# Patient Record
Sex: Female | Born: 1990
Health system: Southern US, Community
[De-identification: ages and names within clinical notes are randomized; demographics above are authoritative.]

## PROBLEM LIST (undated history)

## (undated) DIAGNOSIS — D649 Anemia, unspecified: Secondary | ICD-10-CM

## (undated) DIAGNOSIS — R7989 Other specified abnormal findings of blood chemistry: Secondary | ICD-10-CM

## (undated) HISTORY — DX: Other specified abnormal findings of blood chemistry: R79.89

## (undated) HISTORY — DX: Anemia, unspecified: D64.9

---

## 2015-03-21 ENCOUNTER — Emergency Department (HOSPITAL_COMMUNITY)
Admission: EM | Admit: 2015-03-21 | Discharge: 2015-03-21 | Disposition: A | Discharge status: Home / Self Care | Payer: BLUE CROSS/BLUE SHIELD | Source: Home / Self Care | Attending: Emergency Medicine | Admitting: Emergency Medicine

## 2015-03-21 ENCOUNTER — Encounter (HOSPITAL_COMMUNITY): Payer: Self-pay | Admitting: Emergency Medicine

## 2015-03-21 DIAGNOSIS — N644 Mastodynia: Secondary | ICD-10-CM

## 2015-03-21 LAB — POCT URINALYSIS DIP (DEVICE)
BILIRUBIN URINE: NEGATIVE
Glucose, UA: NEGATIVE mg/dL
Ketones, ur: NEGATIVE mg/dL
LEUKOCYTES UA: NEGATIVE
NITRITE: NEGATIVE
PH: 5.5 (ref 5.0–8.0)
Protein, ur: NEGATIVE mg/dL
Specific Gravity, Urine: >=1.03 (ref 1.005–1.030)
Urobilinogen, UA: 0.2 mg/dL (ref 0.0–1.0)

## 2015-03-21 LAB — POCT PREGNANCY, URINE: PREG TEST UR: NEGATIVE

## 2015-03-21 NOTE — Discharge Instructions (Signed)
Your pregnancy test is negative. You likely have fibrocystic change in your breast. This may fluctuate in tenderness with your periods. You can take Tylenol or ibuprofen as needed for discomfort. Ice or heat may help as well. Follow-up with your PCP to discuss additional workup with ultrasound or mammogram.

## 2015-03-21 NOTE — ED Provider Notes (Signed)
CSN: [id number]     Arrival date & time 03/21/15  0804 History   First MD Initiated Contact with Patient 03/21/15 [phone]     Chief Complaint  Patient presents with  . Breast Pain   (Consider location/radiation/quality/duration/timing/severity/associated sxs/prior Treatment) HPI  She is a 24 year old woman here for evaluation of right breast pain. She states she has had a sore spot in her right breast for several years. It has been checked out previously and she was told it was benign. However, this morning it was hurting quite a bit more than normal so she wanted to get it checked out. [place] in [place] she is on her dad's health insurance. She does have an appointment scheduled with him for next month. She denies any skin changes, nipple discharge, fevers. Her last period was the end of February. No known family history of breast cancer, but she states her grandmother died of metastatic cancer (unknown primary) at age 780.  History reviewed. No pertinent past medical history. History reviewed. No pertinent past surgical history. History reviewed. No pertinent family history. History  Substance Use Topics  . Smoking status: Never Smoker   . Smokeless tobacco: Not on file  . Alcohol Use: Yes     Comment: occassional   OB History    No data available     Review of Systems  Constitutional: Negative for fever.  Genitourinary:       Breast pain    Allergies  Review of patient's allergies indicates no known allergies.  Home Medications   Prior to Admission medications   Not on File   BP 111/60 mmHg  Pulse 86  Temp(Src) 97.9 F (36.6 C) (Oral)  Resp 16  SpO2 100%  LMP 02/18/2015 Physical Exam  Constitutional: She is oriented to person, place, and time. She appears well-developed and well-nourished. No distress.  Cardiovascular: Normal rate.   Pulmonary/Chest: Effort normal.  Genitourinary:  Right breast: No erythema or skin changes. No nipple  discharge. No axillary lymphadenopathy. She does have some tender nodularity in the upper and lower outer quadrants.  Neurological: She is alert and oriented to person, place, and time.    ED Course  Procedures (including critical care time) Labs Review Labs Reviewed  POCT URINALYSIS DIP (DEVICE) - Abnormal; Notable for the following:    Hgb urine dipstick TRACE (*)    All other components within normal limits  POCT PREGNANCY, URINE    Imaging Review No results found.   MDM   1. Breast tenderness    Breast exam is most consistent with fibrocystic tissue.  Nothing to suggest mastitis.  Pregnancy test is negative. Recommended follow-up with PCP as scheduled next month to discuss mammogram versus ultrasound for additional evaluation. She can use Tylenol or ibuprofen as needed for pain. Also recommended trying ice or heat as needed.    Charm RingsErin J Honig, MD 03/21/15 [phone]

## 2015-03-21 NOTE — ED Notes (Signed)
Pt has pain in the right breast pt states that lump has been there for a few years but this morning it is painful.

## 2016-01-24 MED FILL — AMOXICILLIN 500 MG CAPSULE: 500 | 7 days supply | Qty: 24 | Fill #0

## 2016-03-12 DIAGNOSIS — Z0000 Encounter for general adult medical examination without abnormal findings: Secondary | ICD-10-CM | POA: Diagnosis not present

## 2016-03-12 DIAGNOSIS — Z13220 Encounter for screening for lipoid disorders: Secondary | ICD-10-CM | POA: Diagnosis not present

## 2016-03-12 DIAGNOSIS — E559 Vitamin D deficiency, unspecified: Secondary | ICD-10-CM | POA: Diagnosis not present

## 2016-06-07 DIAGNOSIS — E559 Vitamin D deficiency, unspecified: Secondary | ICD-10-CM | POA: Diagnosis not present

## 2016-06-10 DIAGNOSIS — Z23 Encounter for immunization: Secondary | ICD-10-CM | POA: Diagnosis not present

## 2016-08-07 ENCOUNTER — Other Ambulatory Visit (HOSPITAL_COMMUNITY)
Admission: RE | Admit: 2016-08-07 | Discharge: 2016-08-07 | Disposition: A | Discharge status: Home / Self Care | Payer: BLUE CROSS/BLUE SHIELD | Source: Ambulatory Visit | Attending: Family Medicine | Admitting: Family Medicine

## 2016-08-07 ENCOUNTER — Other Ambulatory Visit: Payer: Self-pay | Admitting: Family Medicine

## 2016-08-07 DIAGNOSIS — Z01419 Encounter for gynecological examination (general) (routine) without abnormal findings: Secondary | ICD-10-CM | POA: Insufficient documentation

## 2016-08-07 DIAGNOSIS — Z113 Encounter for screening for infections with a predominantly sexual mode of transmission: Secondary | ICD-10-CM | POA: Insufficient documentation

## 2016-08-12 LAB — CYTOLOGY - PAP

## 2016-11-19 DIAGNOSIS — R229 Localized swelling, mass and lump, unspecified: Secondary | ICD-10-CM | POA: Diagnosis not present

## 2017-04-14 ENCOUNTER — Other Ambulatory Visit: Payer: Self-pay | Admitting: Family Medicine

## 2017-04-14 DIAGNOSIS — E010 Iodine-deficiency related diffuse (endemic) goiter: Secondary | ICD-10-CM | POA: Diagnosis not present

## 2017-04-14 DIAGNOSIS — E559 Vitamin D deficiency, unspecified: Secondary | ICD-10-CM | POA: Diagnosis not present

## 2017-04-14 DIAGNOSIS — Z0001 Encounter for general adult medical examination with abnormal findings: Secondary | ICD-10-CM | POA: Diagnosis not present

## 2017-04-14 DIAGNOSIS — Z13220 Encounter for screening for lipoid disorders: Secondary | ICD-10-CM | POA: Diagnosis not present

## 2017-05-12 ENCOUNTER — Ambulatory Visit
Admission: RE | Admit: 2017-05-12 | Discharge: 2017-05-12 | Disposition: A | Discharge status: Home / Self Care | Payer: 59 | Source: Ambulatory Visit | Attending: Family Medicine | Admitting: Family Medicine

## 2017-05-12 DIAGNOSIS — E010 Iodine-deficiency related diffuse (endemic) goiter: Secondary | ICD-10-CM

## 2017-05-16 DIAGNOSIS — R0982 Postnasal drip: Secondary | ICD-10-CM | POA: Diagnosis not present

## 2017-05-16 DIAGNOSIS — J028 Acute pharyngitis due to other specified organisms: Secondary | ICD-10-CM | POA: Diagnosis not present

## 2018-04-22 ENCOUNTER — Other Ambulatory Visit (HOSPITAL_COMMUNITY)
Admission: RE | Admit: 2018-04-22 | Discharge: 2018-04-22 | Disposition: A | Discharge status: Home / Self Care | Payer: 59 | Source: Ambulatory Visit | Attending: Family Medicine | Admitting: Family Medicine

## 2018-04-22 ENCOUNTER — Other Ambulatory Visit: Payer: Self-pay | Admitting: Family Medicine

## 2018-04-22 DIAGNOSIS — Z0000 Encounter for general adult medical examination without abnormal findings: Secondary | ICD-10-CM | POA: Diagnosis not present

## 2018-04-22 DIAGNOSIS — Z01419 Encounter for gynecological examination (general) (routine) without abnormal findings: Secondary | ICD-10-CM | POA: Insufficient documentation

## 2018-04-22 DIAGNOSIS — E559 Vitamin D deficiency, unspecified: Secondary | ICD-10-CM | POA: Diagnosis not present

## 2018-04-24 LAB — CYTOLOGY - PAP: DIAGNOSIS: NEGATIVE

## 2018-05-20 DIAGNOSIS — Z0000 Encounter for general adult medical examination without abnormal findings: Secondary | ICD-10-CM | POA: Diagnosis not present

## 2018-08-14 IMAGING — US US THYROID
1 series · 14 of 25 positions shown · non-contrast
Comparison: None.

CLINICAL DATA: Palpable abnormality. Enlarged thyroid gland by
physical exam.

EXAM:
THYROID ULTRASOUND
TECHNIQUE: Ultrasound examination of the thyroid gland and adjacent soft
tissues was performed.

[Series 1: us thyroid · 0.06mm/px · 14 of 33 slices shown]
[im 1/33]
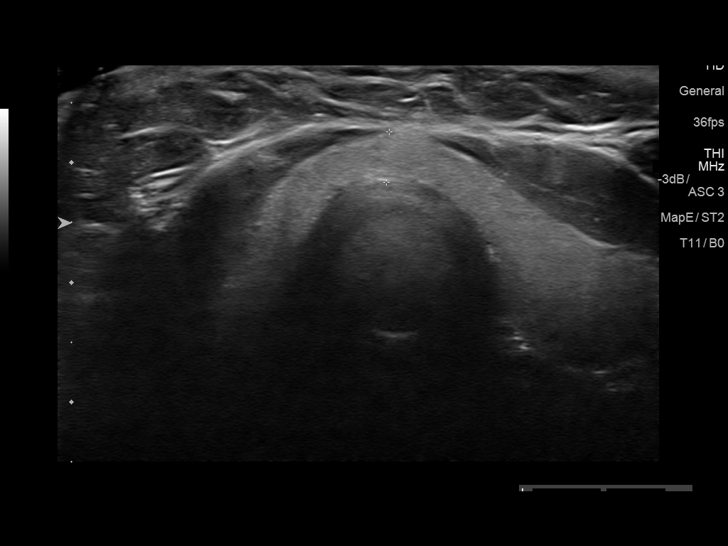
[im 3/33]
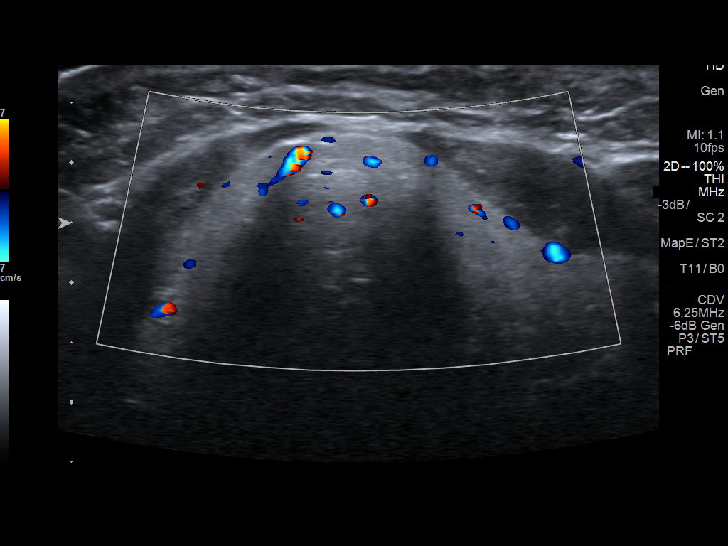
[im 6/33]
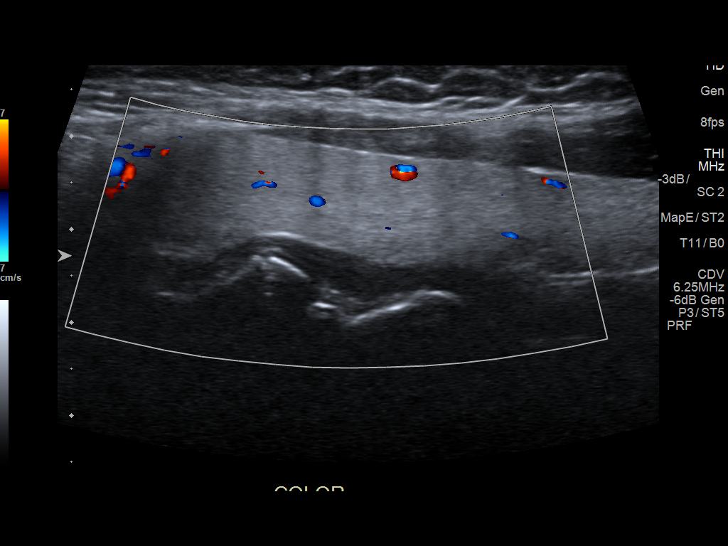
[im 9/33]
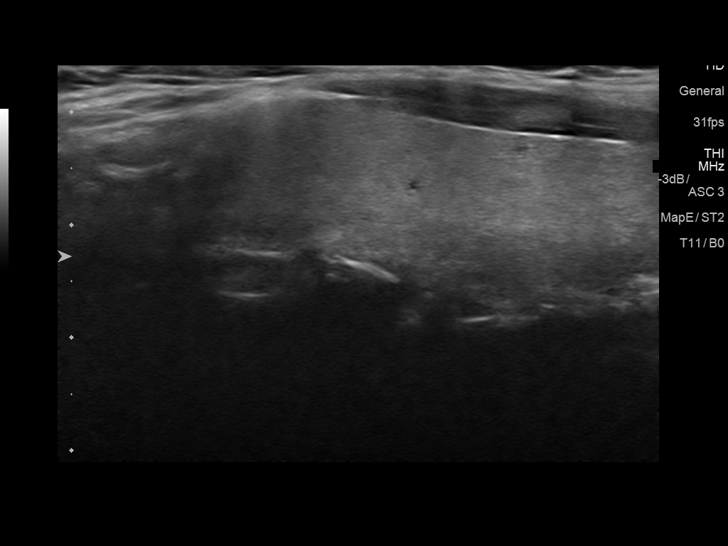
[im 11/33]
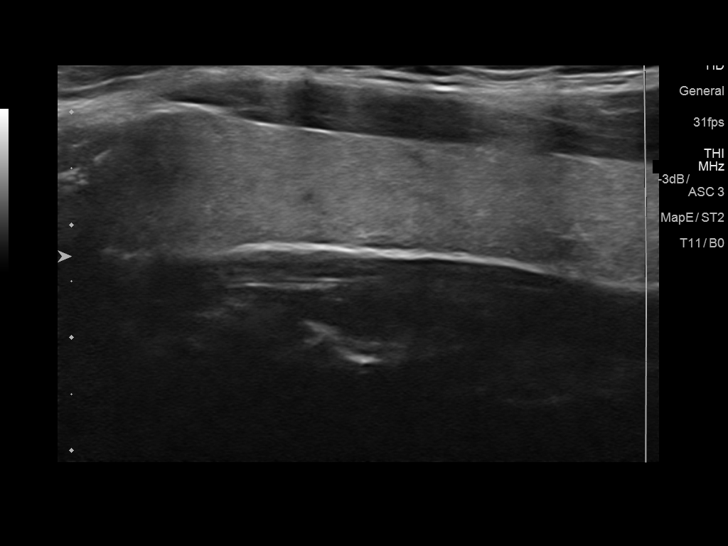
[im 13/33]
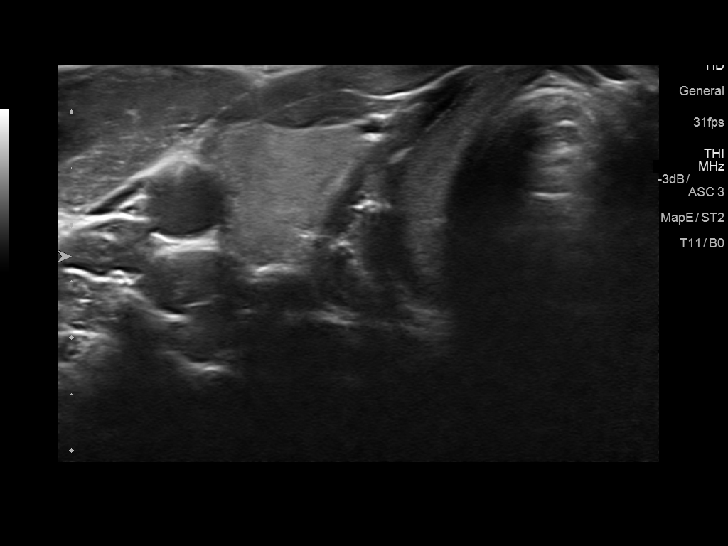
[im 15/33]
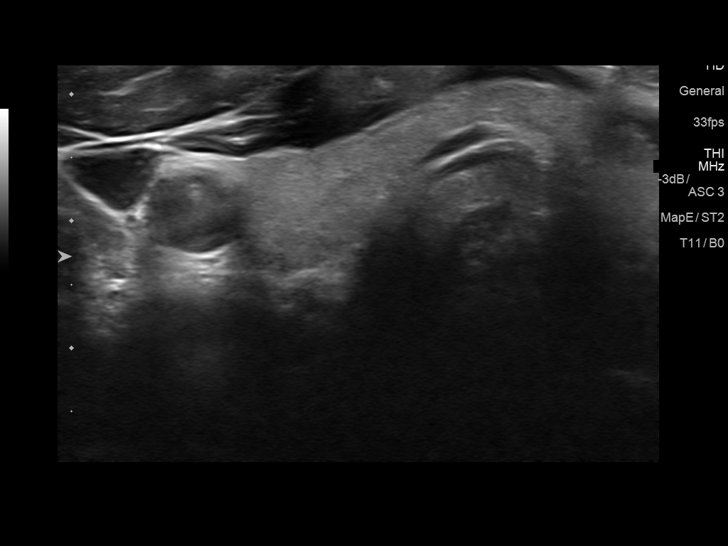
[im 18/33]
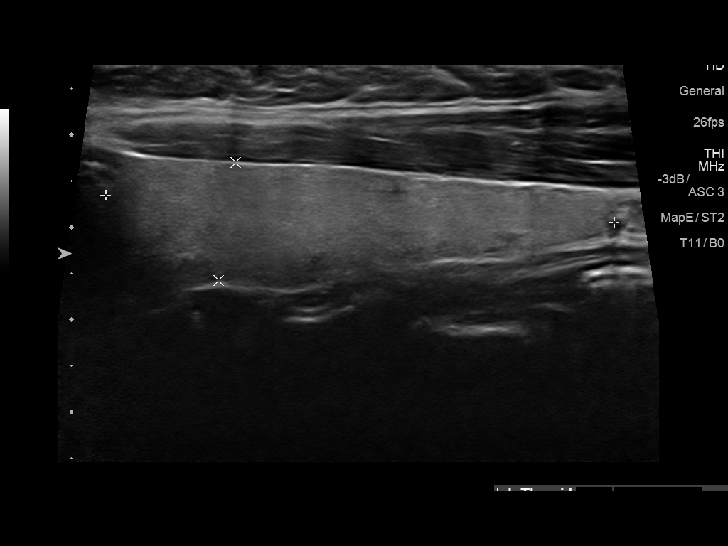
[im 21/33]
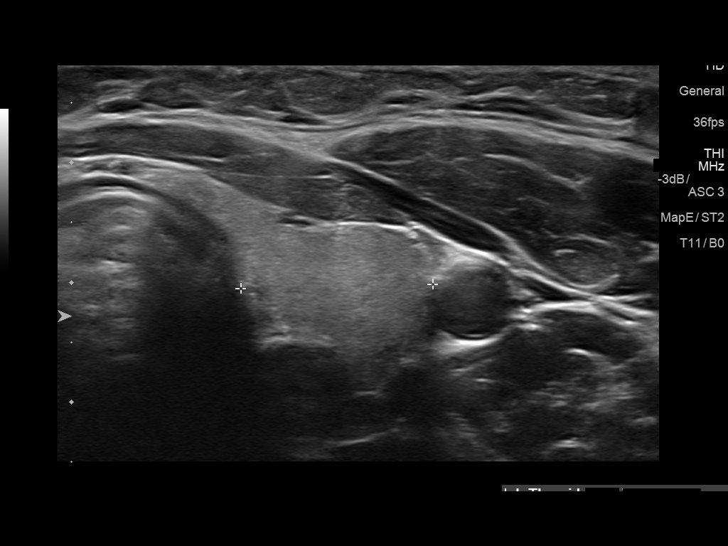
[im 22/33]
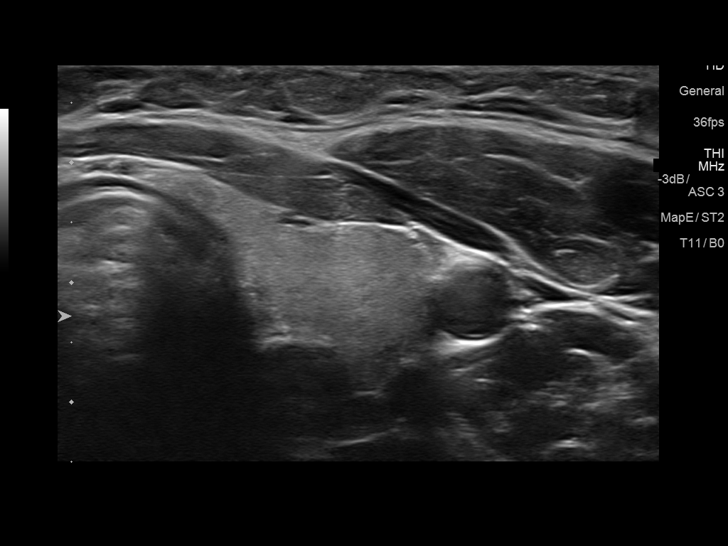
[im 25/33]
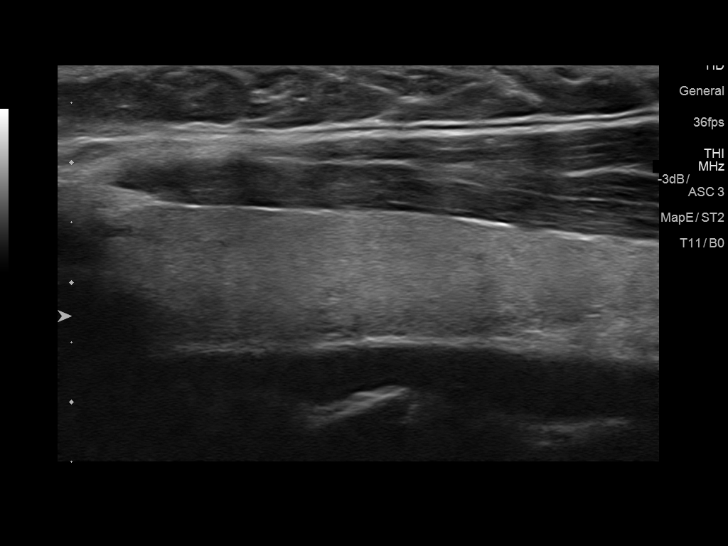
[im 27/33]
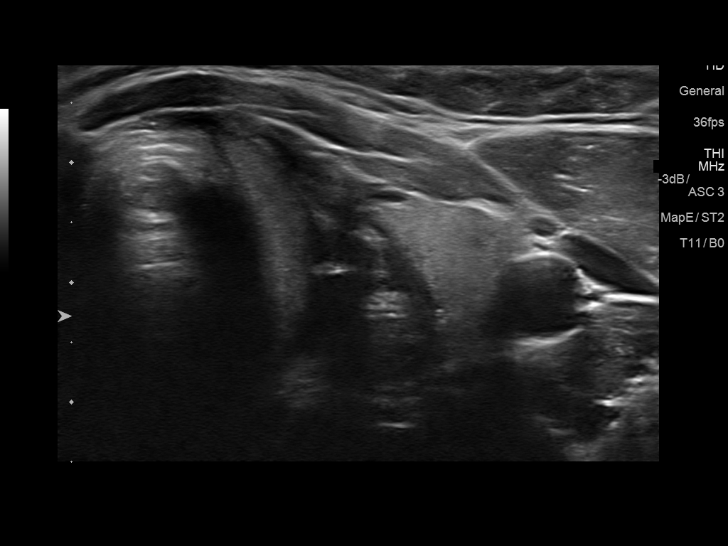
[im 30/33]
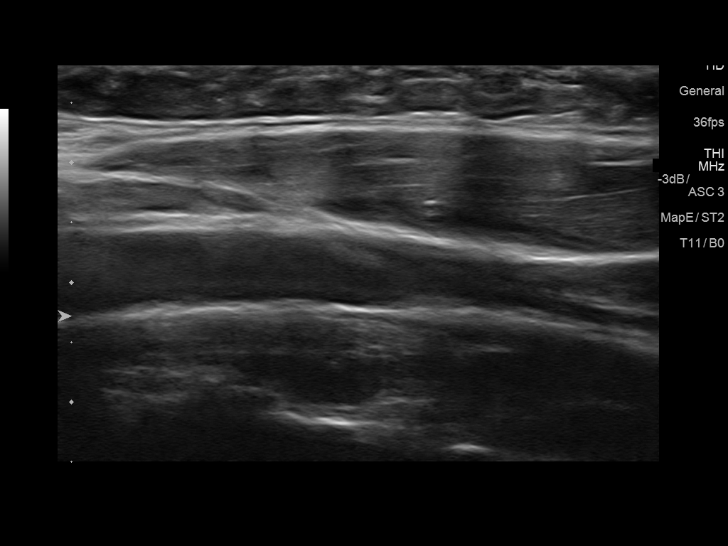
[im 33/33]
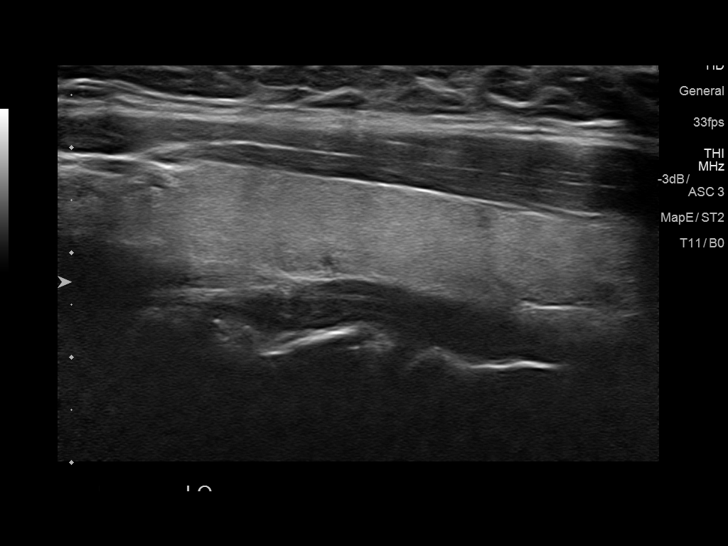

[14 of 25 positions shown; findings below may reference images not displayed]

FINDINGS: Parenchymal Echotexture: Normal

Isthmus: 0.4 cm

Right lobe: 5.8 x 1.3 x 1.6 cm

Left lobe: 5.5 x 1.3 x 1.6 cm

_________________________________________________________

Estimated total number of nodules >/= 1 cm: 0

Number of spongiform nodules >/=  2 cm not described below (TR1): 0

Number of mixed cystic and solid nodules >/= 1.5 cm not described
below (TR2): 0

_________________________________________________________

No discrete nodules are seen within the thyroid gland.
IMPRESSION: The gland is normal in size.  No evidence of nodule.

The above is in keeping with the ACR TI-RADS recommendations - [HOSPITAL] 1726;[DATE].

## 2020-08-18 ENCOUNTER — Other Ambulatory Visit: Payer: Self-pay | Admitting: Physician Assistant

## 2020-08-18 DIAGNOSIS — N920 Excessive and frequent menstruation with regular cycle: Secondary | ICD-10-CM

## 2020-08-25 ENCOUNTER — Ambulatory Visit
Admission: RE | Admit: 2020-08-25 | Discharge: 2020-08-25 | Disposition: A | Discharge status: Home / Self Care | Payer: 59 | Source: Ambulatory Visit | Attending: Physician Assistant | Admitting: Physician Assistant

## 2020-08-25 DIAGNOSIS — N920 Excessive and frequent menstruation with regular cycle: Secondary | ICD-10-CM

## 2020-08-30 ENCOUNTER — Encounter (INDEPENDENT_AMBULATORY_CARE_PROVIDER_SITE_OTHER): Payer: Self-pay

## 2020-09-06 ENCOUNTER — Other Ambulatory Visit: Payer: Self-pay

## 2020-09-06 ENCOUNTER — Encounter (INDEPENDENT_AMBULATORY_CARE_PROVIDER_SITE_OTHER): Payer: Self-pay | Admitting: Bariatrics

## 2020-09-06 ENCOUNTER — Ambulatory Visit (INDEPENDENT_AMBULATORY_CARE_PROVIDER_SITE_OTHER): Payer: 59 | Admitting: Bariatrics

## 2020-09-06 VITALS — BP 124/84 | HR 81 | Temp 98.0°F | Ht 67.0 in | Wt 272.0 lb

## 2020-09-06 DIAGNOSIS — D649 Anemia, unspecified: Secondary | ICD-10-CM | POA: Diagnosis not present

## 2020-09-06 DIAGNOSIS — E559 Vitamin D deficiency, unspecified: Secondary | ICD-10-CM | POA: Diagnosis not present

## 2020-09-06 DIAGNOSIS — R7309 Other abnormal glucose: Secondary | ICD-10-CM

## 2020-09-06 DIAGNOSIS — Z9189 Other specified personal risk factors, not elsewhere classified: Secondary | ICD-10-CM

## 2020-09-06 DIAGNOSIS — R0602 Shortness of breath: Secondary | ICD-10-CM | POA: Diagnosis not present

## 2020-09-06 DIAGNOSIS — R5383 Other fatigue: Secondary | ICD-10-CM | POA: Diagnosis not present

## 2020-09-06 DIAGNOSIS — Z0289 Encounter for other administrative examinations: Secondary | ICD-10-CM

## 2020-09-06 DIAGNOSIS — Z1331 Encounter for screening for depression: Secondary | ICD-10-CM | POA: Diagnosis not present

## 2020-09-06 DIAGNOSIS — Z6841 Body Mass Index (BMI) 40.0 and over, adult: Secondary | ICD-10-CM

## 2020-09-12 ENCOUNTER — Encounter (INDEPENDENT_AMBULATORY_CARE_PROVIDER_SITE_OTHER): Payer: Self-pay | Admitting: Bariatrics

## 2020-09-12 NOTE — Progress Notes (Signed)
Dear Roslynn Amble, PA,   Thank you for referring Alyssa Henry to our clinic. The following note includes my evaluation and treatment recommendations.  Chief Complaint:   OBESITY Alyssa Henry (MR# [id number]) is a 29 y.o. female who presents for evaluation and treatment of obesity and related comorbidities. Current BMI is Body mass index is 42.6 kg/m. Alyssa Henry has been struggling with her weight for many years and has been unsuccessful in either losing weight, maintaining weight loss, or reaching her healthy weight goal.  Alyssa Henry is currently in the action stage of change and ready to dedicate time achieving and maintaining a healthier weight. Alyssa Henry is interested in becoming our patient and working on intensive lifestyle modifications including (but not limited to) diet and exercise for weight loss.  Alyssa Henry states that she "is on the go" a lot, which makes it difficult to plan meals.  Alyssa Henry's habits were reviewed today and are as follows: her desired weight loss is 87 pounds, she has been heavy most of her life, she started gaining weight in elementary/middle school, her heaviest weight ever was 272 pounds, she craves sweets - ice cream in particular, she skips dinner sometimes, she is frequently drinking liquids with calories, she frequently makes poor food choices, she has problems with excessive hunger, she frequently eats larger portions than normal and she struggles with emotional eating.  Depression Screen Alyssa Henry's Food and Mood (modified PHQ-9) score was 7.  Depression screen PHQ 2/9 09/06/2020  Decreased Interest 1  Down, Depressed, Hopeless 1  PHQ - 2 Score 2  Altered sleeping 0  Tired, decreased energy 2  Change in appetite 2  Feeling bad or failure about yourself  1  Trouble concentrating 0  Moving slowly or fidgety/restless 0  Suicidal thoughts 0  PHQ-9 Score 7  Difficult doing work/chores Not difficult at all   Subjective:   1. SOB (shortness of  breath) on exertion Kazia notes increasing shortness of breath with exercising and seems to be worsening over time with weight gain. She notes getting out of breath sooner with activity than she used to. This has gotten worse recently. Alyssa Henry denies shortness of breath at rest or orthopnea.  2. Other fatigue Alyssa Henry denies daytime somnolence and denies waking up still tired. Patent has a history of symptoms of occasional snoring. [place] generally gets 6-8 hours of sleep per night, and states that she has generally restful sleep. Snoring is not present. Apneic episodes are not present. Epworth Sleepiness Score is 2.  3. Anemia, unspecified type Alyssa Henry is not a vegetarian.  She does not have a history of weight loss surgery.  Last CBC was okay.  She was diagnosed around 5 years ago.  4. Vitamin D deficiency She is currently taking OTC vitamin D 2,000 IU each day. She denies nausea, vomiting or muscle weakness.  5. Elevated glucose Denies polyphagia.  6. Depression screening Alyssa Henry was screened for depression as part of her new patient workup.  PHQ-9 is 7.  7. At risk for diabetes mellitus Alyssa Henry is at higher than average risk for developing diabetes due to her obesity.   Assessment/Plan:   1. SOB (shortness of breath) on exertion Alyssa Henry does feel that she gets out of breath more easily that she used to when she exercises. Alyssa Henry's shortness of breath appears to be obesity related and exercise induced. She has agreed to work on weight loss and gradually increase exercise to treat her exercise induced shortness of breath. Will continue to monitor closely.  2. Other fatigue Alyssa Henry does feel that her weight is causing her energy to be lower than it should be. Fatigue may be related to obesity, depression or many other causes. Labs will be ordered, and in the meanwhile, Alyssa Henry will focus on self care including making healthy food choices, increasing physical activity and  focusing on stress reduction.  - EKG 12-Lead - Hemoglobin A1c - Insulin, random  3. Anemia, unspecified type PCP will follow.    4. Vitamin D deficiency Low Vitamin D level contributes to fatigue and are associated with obesity, breast, and colon cancer. She agrees to continue to take OTC Vitamin D @2 ,000 IU daily and will follow-up for routine testing of Vitamin D, at least 2-3 times per year to avoid over-replacement.  5. Elevated glucose Will check A1c and insulin today.  - Hemoglobin A1c - Insulin, random  6. Depression screening Depression screen was mildly positive today.  Alyssa Henry had a positive depression screening. Depression is commonly associated with obesity and often results in emotional eating behaviors. We will monitor this closely and work on CBT to help improve the non-hunger eating patterns. Referral to Psychology may be required if no improvement is seen as she continues in our clinic.  7. At risk for diabetes mellitus Alyssa Henry was given approximately 15 minutes of diabetes education and counseling today. We discussed intensive lifestyle modifications today with an emphasis on weight loss as well as increasing exercise and decreasing simple carbohydrates in her diet. We also reviewed medication options with an emphasis on risk versus benefit of those discussed.   Repetitive spaced learning was employed today to elicit superior memory formation and behavioral change.  8. Class 3 severe obesity with serious comorbidity and body mass index (BMI) of 40.0 to 44.9 in adult, unspecified obesity type (HCC)  Alyssa Henry is currently in the action stage of change and her goal is to continue with weight loss efforts. I recommend Alyssa Henry begin the structured treatment plan as follows:  She has agreed to the Category 3 Plan.  She will work on meal planning, intentional eating, decreasing unhealthy snacking.  Labs from 08/14/2020 were reviewed and include CBC, CMP, lipids, TSH, and  vitamin D.  Exercise goals: All adults should avoid inactivity. Some physical activity is better than none, and adults who participate in any amount of physical activity gain some health benefits.   Behavioral modification strategies: increasing lean protein intake, decreasing simple carbohydrates, increasing vegetables, increasing water intake, decreasing eating out, no skipping meals, meal planning and cooking strategies, keeping healthy foods in the home, better snacking choices, emotional eating strategies and planning for success.  She was informed of the importance of frequent follow-up visits to maximize her success with intensive lifestyle modifications for her multiple health conditions. She was informed we would discuss her lab results at her next visit unless there is a critical issue that needs to be addressed sooner. Malka agreed to keep her next visit at the agreed upon time to discuss these results.  Objective:   Blood pressure 124/84, pulse 81, temperature 98 F (36.7 C), height 5\' 7"  (1.702 m), weight 272 lb (123.4 kg), last menstrual period 08/27/2020, SpO2 100 %. Body mass index is 42.6 kg/m.  EKG: Normal sinus rhythm, rate 85 bpm.  Indirect Calorimeter completed today shows a VO2 of 321 and a REE of 2237.  Her calculated basal metabolic rate is 10/27/2020 thus her basal metabolic rate is better than expected.  General: Cooperative, alert, well developed, in no acute distress.  HEENT: Conjunctivae and lids unremarkable. Cardiovascular: Regular rhythm.  Lungs: Normal work of breathing. Neurologic: No focal deficits.   Attestation Statements:   Reviewed by clinician on day of visit: allergies, medications, problem list, medical history, surgical history, family history, social history, and previous encounter notes.  I, Insurance claims handler, CMA, am acting as Energy manager for [employer], DO  I have reviewed the above documentation for accuracy and completeness, and I agree with  the above. Corinna Capra, DO

## 2020-09-20 ENCOUNTER — Ambulatory Visit (INDEPENDENT_AMBULATORY_CARE_PROVIDER_SITE_OTHER): Payer: Self-pay | Admitting: Bariatrics

## 2020-09-20 ENCOUNTER — Telehealth (INDEPENDENT_AMBULATORY_CARE_PROVIDER_SITE_OTHER): Payer: Self-pay | Admitting: Adult Health

## 2020-10-04 ENCOUNTER — Other Ambulatory Visit (INDEPENDENT_AMBULATORY_CARE_PROVIDER_SITE_OTHER): Payer: Self-pay | Admitting: Bariatrics

## 2020-10-05 ENCOUNTER — Ambulatory Visit (INDEPENDENT_AMBULATORY_CARE_PROVIDER_SITE_OTHER): Payer: 59 | Admitting: Bariatrics

## 2022-07-31 ENCOUNTER — Encounter (INDEPENDENT_AMBULATORY_CARE_PROVIDER_SITE_OTHER): Payer: Self-pay

## 2022-09-01 IMAGING — US US PELVIS COMPLETE WITH TRANSVAGINAL
1 series · 14 of 25 positions shown · non-contrast
Comparison: None

CLINICAL DATA: Menorrhagia

EXAM:
TRANSABDOMINAL AND TRANSVAGINAL ULTRASOUND OF PELVIS
TECHNIQUE: Both transabdominal and transvaginal ultrasound examinations of the
pelvis were performed. Transabdominal technique was performed for
global imaging of the pelvis including uterus, ovaries, adnexal
regions, and pelvic cul-de-sac. It was necessary to proceed with
endovaginal exam following the transabdominal exam to visualize the
endometrium ovaries and adnexa.

[Series 1: us pelvis complete with transvaginal · 0.33mm/px · 14 of 41 slices shown]
[im 1/41]
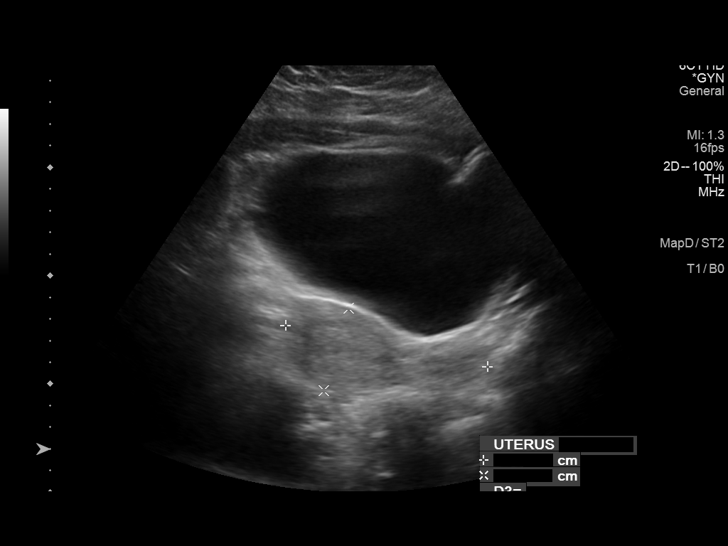
[im 4/41]
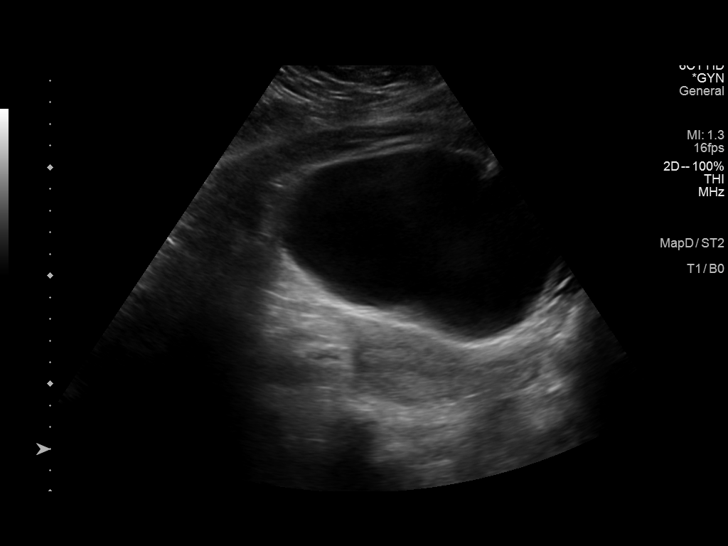
[im 7/41]
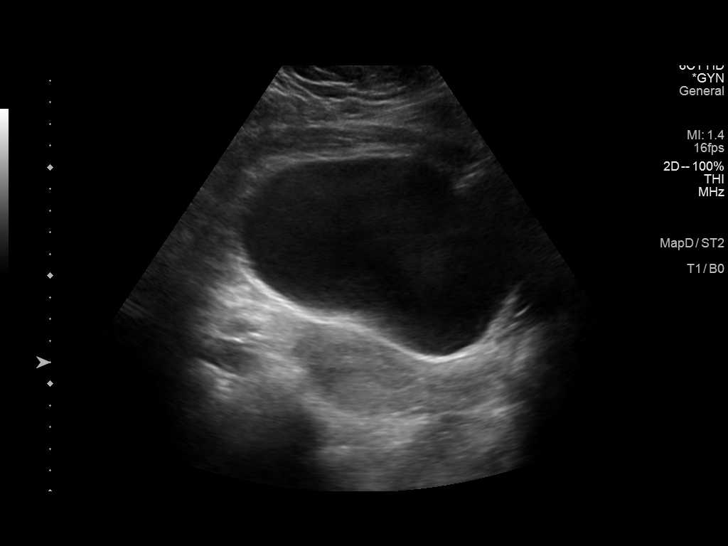
[im 11/41]
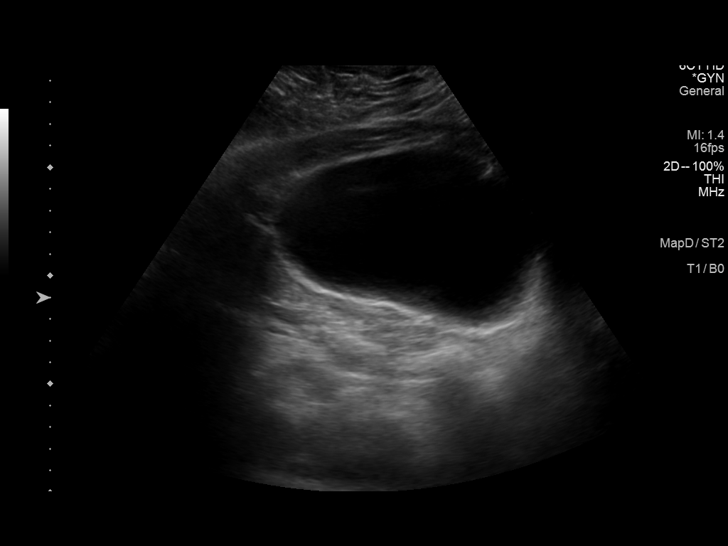
[im 14/41]
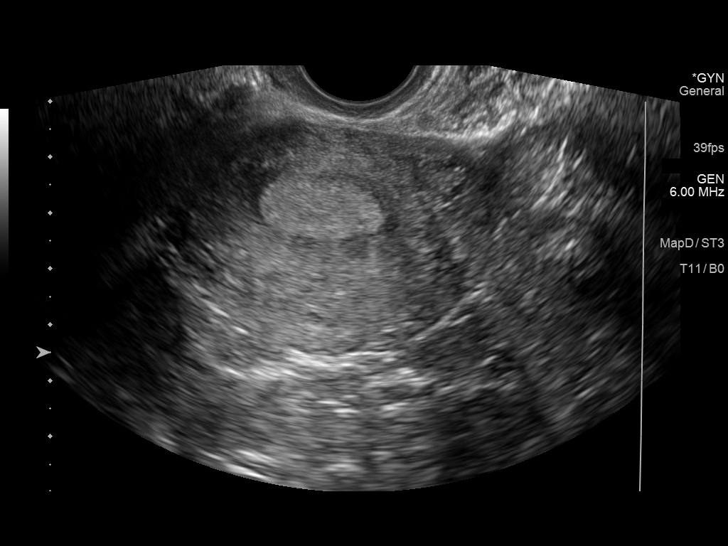
[im 16/41]
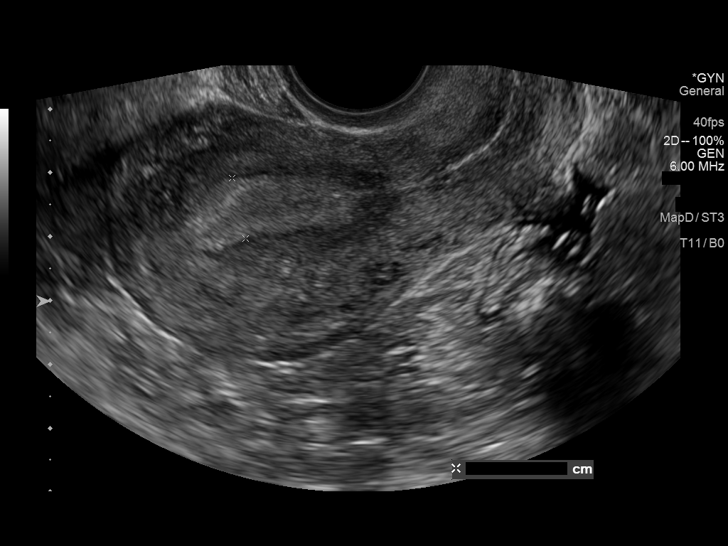
[im 19/41]
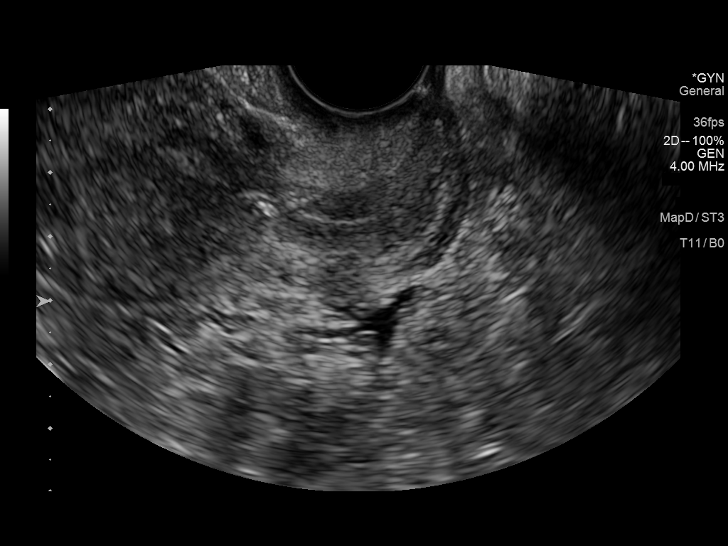
[im 22/41]
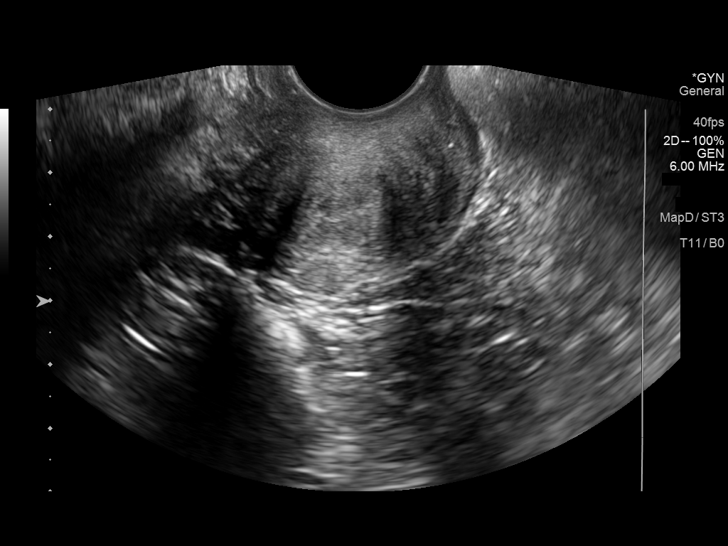
[im 26/41]
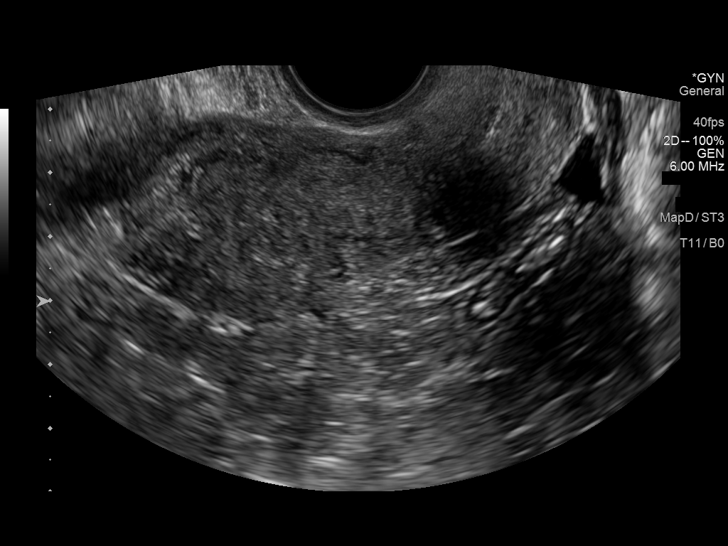
[im 27/41]
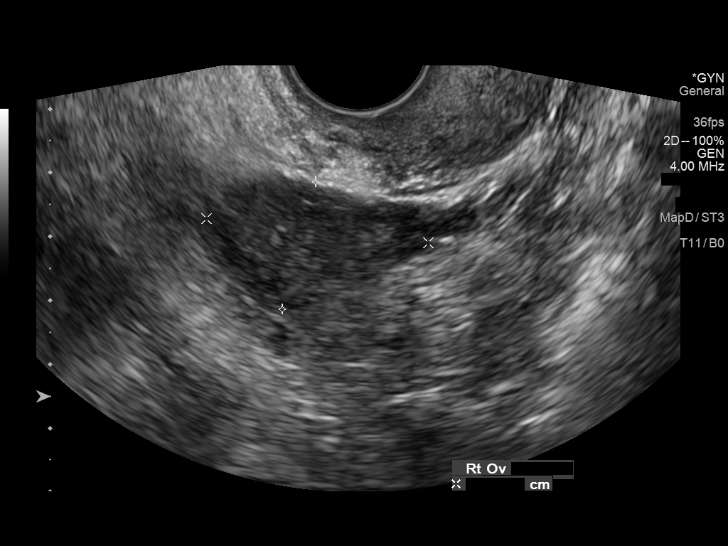
[im 31/41]
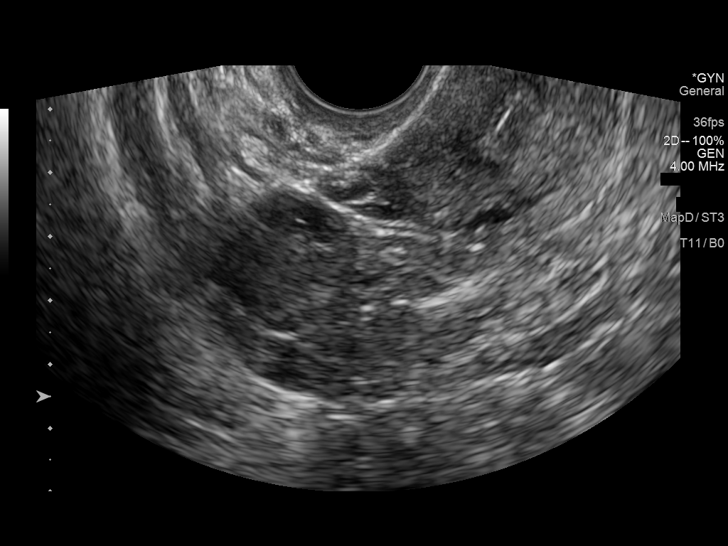
[im 34/41]
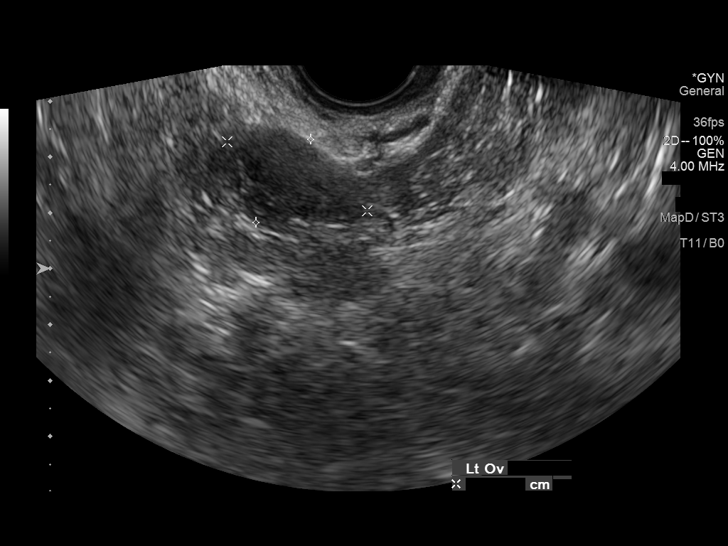
[im 37/41]
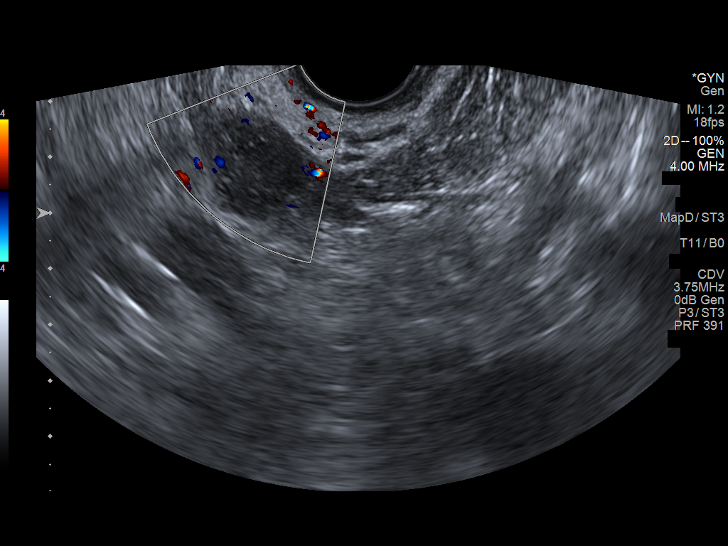
[im 41/41]
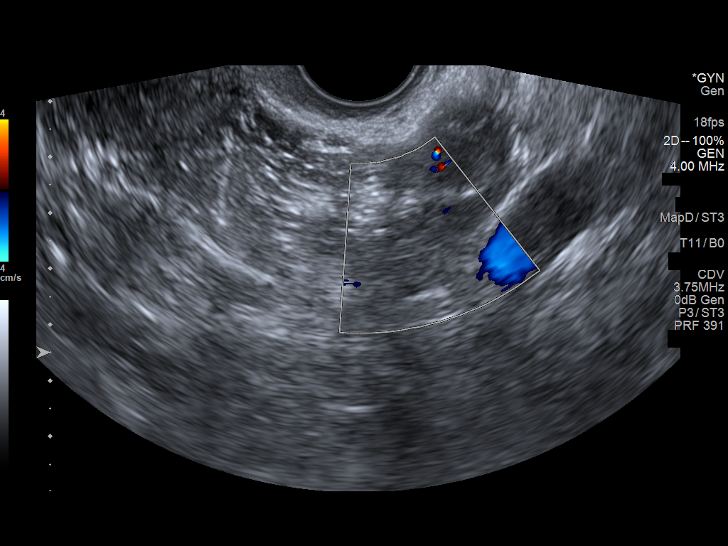

[14 of 25 positions shown; findings below may reference images not displayed]

FINDINGS: Uterus

Measurements: 9.5 x 4 x 4.6 cm = volume: 91.3 mL. No fibroids or
other mass visualized.

Endometrium

Thickness: 9.7 mm.  No focal abnormality visualized.

Right ovary

Measurements: 3.5 x 2.1 x 2.3 cm = volume: 8.5 mL. Normal
appearance/no adnexal mass.

Left ovary

Measurements: 2.8 x 1.8 x 2.2 cm = volume: 5.6 mL. Normal
appearance/no adnexal mass.

Other findings

Trace free fluid in the pelvis.
IMPRESSION: Trace free fluid in the pelvis. Otherwise negative pelvic
ultrasound.
